# Patient Record
Sex: Female | Born: 1990 | Race: White | Hispanic: No | Marital: Married | Smoking: Never smoker
Health system: Southern US, Community
[De-identification: ages and names within clinical notes are randomized; demographics above are authoritative.]

## PROBLEM LIST (undated history)

## (undated) DIAGNOSIS — Z862 Personal history of diseases of the blood and blood-forming organs and certain disorders involving the immune mechanism: Secondary | ICD-10-CM

## (undated) HISTORY — PX: TONSILLECTOMY: SUR1361

---

## 2016-08-05 ENCOUNTER — Emergency Department (HOSPITAL_COMMUNITY): Payer: BLUE CROSS/BLUE SHIELD

## 2016-08-05 ENCOUNTER — Emergency Department (HOSPITAL_COMMUNITY)
Admission: EM | Admit: 2016-08-05 | Discharge: 2016-08-05 | Disposition: A | Discharge status: Home / Self Care | Payer: BLUE CROSS/BLUE SHIELD | Attending: Emergency Medicine | Admitting: Emergency Medicine

## 2016-08-05 ENCOUNTER — Encounter (HOSPITAL_COMMUNITY): Payer: Self-pay

## 2016-08-05 DIAGNOSIS — Y9389 Activity, other specified: Secondary | ICD-10-CM | POA: Insufficient documentation

## 2016-08-05 DIAGNOSIS — S59911A Unspecified injury of right forearm, initial encounter: Secondary | ICD-10-CM | POA: Diagnosis present

## 2016-08-05 DIAGNOSIS — S5011XA Contusion of right forearm, initial encounter: Secondary | ICD-10-CM | POA: Diagnosis not present

## 2016-08-05 DIAGNOSIS — Y92000 Kitchen of unspecified non-institutional (private) residence as  the place of occurrence of the external cause: Secondary | ICD-10-CM | POA: Diagnosis not present

## 2016-08-05 DIAGNOSIS — T148XXA Other injury of unspecified body region, initial encounter: Secondary | ICD-10-CM

## 2016-08-05 DIAGNOSIS — Y999 Unspecified external cause status: Secondary | ICD-10-CM | POA: Insufficient documentation

## 2016-08-05 HISTORY — DX: Personal history of diseases of the blood and blood-forming organs and certain disorders involving the immune mechanism: Z86.2

## 2016-08-05 MED ORDER — HYDROCODONE-ACETAMINOPHEN 5-325 MG PO TABS
1.0000 | ORAL_TABLET | Freq: Once | ORAL | Status: AC
  Administered 2016-08-05: 1 via ORAL
  Filled 2016-08-05: qty 1

## 2016-08-05 MED ORDER — HYDROCODONE-ACETAMINOPHEN 5-325 MG PO TABS: ORAL_TABLET | ORAL | 0 refills | Status: AC
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
## 2016-08-05 NOTE — Discharge Instructions (Signed)
Rest, Ice intermittently (in the first 24-48 hours), Gentle compression with an Ace wrap, and elevate (Limb above the level of the heart) °  °Take up to 800mg of ibuprofen (that is usually 4 over the counter pills)  3 times a day for 5 days. Take with food. ° °Take vicodin for breakthrough pain, do not drink alcohol, drive, care for children or do other critical tasks while taking vicodin. ° °

## 2016-08-05 NOTE — ED Notes (Signed)
PT DISCHARGED. INSTRUCTIONS AND PRESCRIPTION GIVEN. AAOX4. PT IN NO APPARENT DISTRESS. THE OPPORTUNITY TO ASK QUESTIONS WAS PROVIDED. 
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
## 2016-09-16 ENCOUNTER — Emergency Department
Admission: EM | Admit: 2016-09-16 | Discharge: 2016-09-16 | Disposition: A | Discharge status: Home / Self Care | Payer: BLUE CROSS/BLUE SHIELD | Source: Home / Self Care | Attending: Family Medicine | Admitting: Family Medicine

## 2016-09-16 DIAGNOSIS — N3001 Acute cystitis with hematuria: Secondary | ICD-10-CM | POA: Diagnosis not present

## 2016-09-16 LAB — POCT URINALYSIS DIP (MANUAL ENTRY)
BILIRUBIN UA: NEGATIVE
GLUCOSE UA: NEGATIVE
Ketones, POC UA: NEGATIVE
Nitrite, UA: NEGATIVE
PH UA: 7 (ref 5–8)
Protein Ur, POC: 100 — AB
Spec Grav, UA: 1.02 (ref 1.005–1.03)
Urobilinogen, UA: 1 (ref 0–1)

## 2016-09-16 MED ORDER — CEPHALEXIN 500 MG PO CAPS: 500.0000 mg | ORAL_CAPSULE | Freq: Two times a day (BID) | ORAL | 0 refills | Status: AC

## 2016-09-16 MED ORDER — PHENAZOPYRIDINE HCL 200 MG PO TABS: 200.0000 mg | ORAL_TABLET | Freq: Three times a day (TID) | ORAL | 0 refills | Status: AC

## 2016-09-16 NOTE — ED Provider Notes (Signed)
CSN: [id number]     Arrival date & time 09/16/16  1717 History   First MD Initiated Contact with Patient 09/16/16 1738     Chief Complaint  Patient presents with  . Urinary Frequency  . Hematuria   (Consider location/radiation/quality/duration/timing/severity/associated sxs/prior Treatment) HPI  Holly Reed is a 25 y.o. female presenting to UC with c/o urinary discomfort and lower abdominal pain for 1 day. She has taken ibuprofen with mild relief.   Symptoms c/w prior UTIs.  Denies fever, chills, n/v/d. Denies vaginal discharge. Denies concern for STDs.    Past Medical History:  Diagnosis Date  . History of ITP    Past Surgical History:  Procedure Laterality Date  . TONSILLECTOMY     History reviewed. No pertinent family history. Social History  Substance Use Topics  . Smoking status: Never Smoker  . Smokeless tobacco: Never Used  . Alcohol use Yes     Comment: 4   OB History    No data available     Review of Systems  Gastrointestinal: Positive for abdominal pain. Negative for diarrhea, nausea and vomiting.  Genitourinary: Positive for dysuria, frequency, pelvic pain (bladder pressure) and urgency. Negative for decreased urine volume, flank pain, genital sores, hematuria, vaginal bleeding, vaginal discharge and vaginal pain.  Musculoskeletal: Negative for back pain and myalgias.    Allergies  Penicillins  Home Medications   Prior to Admission medications   Medication Sig Start Date End Date Taking? Authorizing Provider  cephALEXin (KEFLEX) 500 MG capsule Take 1 capsule (500 mg total) by mouth 2 (two) times daily. For 7 days 09/16/16   Junius FinnerErin O'Malley, PA-C  HYDROcodone-acetaminophen (NORCO/VICODIN) 5-325 MG tablet Take 1-2 tablets by mouth every 6 hours as needed for pain and/or cough. 08/05/16   Nicole Pisciotta, PA-C  phenazopyridine (PYRIDIUM) 200 MG tablet Take 1 tablet (200 mg total) by mouth 3 (three) times daily. 09/16/16   Junius FinnerErin O'Malley, PA-C   Meds  Ordered and Administered this Visit  Medications - No data to display  BP 108/73 (BP Location: Left Arm)   Pulse 92   Temp 98.1 F (36.7 C) (Oral)   Ht 5\' 1"  (1.549 m)   Wt 223 lb (101.2 kg)   SpO2 95%   BMI 42.14 kg/m  No data found.   Physical Exam  Constitutional: She is oriented to person, place, and time. She appears well-developed and well-nourished. No distress.  HENT:  Head: Normocephalic and atraumatic.  Mouth/Throat: Oropharynx is clear and moist.  Eyes: EOM are normal.  Neck: Normal range of motion.  Cardiovascular: Normal rate and regular rhythm.   Pulmonary/Chest: Effort normal and breath sounds normal. No respiratory distress. She has no wheezes. She has no rales.  Abdominal: Soft. She exhibits no distension and no mass. There is no tenderness. There is no rebound, no guarding and no CVA tenderness.  Musculoskeletal: Normal range of motion.  Neurological: She is alert and oriented to person, place, and time.  Skin: Skin is warm and dry. She is not diaphoretic.  Psychiatric: She has a normal mood and affect. Her behavior is normal.  Nursing note and vitals reviewed.   Urgent Care Course   Clinical Course     Procedures (including critical care time)  Labs Review Labs Reviewed  POCT URINALYSIS DIP (MANUAL ENTRY) - Abnormal; Notable for the following:       Result Value   Clarity, UA cloudy (*)    Blood, UA moderate (*)    Protein Ur, POC =100 (*)  Leukocytes, UA small (1+) (*)    All other components within normal limits  URINE CULTURE   Narrative:    Performed at:  [facility]                [address], [address]                [place], [place] [id number] SUSCEPTIBILITY TEST REPORT TO FOLLOW    Imaging Review No results found.    MDM   1. Acute cystitis with hematuria    Pt c/o 1 days of urinary symptoms.  UA: small leukocytes and moderate blood. Will send culture.  Due to pt being symptomatic, will prescribe Keflex and  Pyridium  F/u with PCP in 1 week as needed.      Junius FinnerErin O'Malley, PA-C 09/19/16 [phone]

## 2016-09-16 NOTE — Discharge Instructions (Signed)
°  If your insurance does not cover pyridium, you may try over the counter medication called Azo to help with bladder spasms.  This medication can make your urine orange, which is normal.

## 2016-09-16 NOTE — ED Triage Notes (Signed)
Started yesterday with discomfort when urinating.  Today has been constant discomfort.

## 2016-09-17 ENCOUNTER — Telehealth: Payer: Self-pay | Admitting: *Deleted

## 2016-09-17 NOTE — Telephone Encounter (Signed)
Patient was seen last night,reports significant bladder pain.  She has taken 2 doses of antibiotic and 1 does of pyridium. I advised her pyridium is what we prescribe for the bladder pain. May add AZO and 800mg  IBF q8h. Encouraged to continue antibiotic as prescribed.

## 2016-09-19 LAB — URINE CULTURE

## 2016-09-20 ENCOUNTER — Telehealth: Payer: Self-pay | Admitting: *Deleted

## 2016-09-20 NOTE — Telephone Encounter (Signed)
Callback: No answer, LMOM f/u from visit. UCX result. Call back if not improving.
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
